# Patient Record
Sex: Female | Born: 1965 | Smoking: Never smoker
Health system: Southern US, Community
[De-identification: ages and names within clinical notes are randomized; demographics above are authoritative.]

## PROBLEM LIST (undated history)

## (undated) DIAGNOSIS — R1900 Intra-abdominal and pelvic swelling, mass and lump, unspecified site: Secondary | ICD-10-CM

---

## 2012-05-24 ENCOUNTER — Emergency Department: Payer: Self-pay | Admitting: Emergency Medicine

## 2012-05-24 LAB — URINALYSIS, COMPLETE
Bacteria: NONE SEEN
Nitrite: NEGATIVE
Protein: NEGATIVE
RBC,UR: 229797 /HPF (ref 0–5)
RBC,UR: 3 /HPF (ref 0–5)
Specific Gravity: 1.019 (ref 1.003–1.030)
Squamous Epithelial: NONE SEEN

## 2012-05-24 LAB — PREGNANCY, URINE: Pregnancy Test, Urine: NEGATIVE m[IU]/mL

## 2012-05-24 LAB — CBC
HCT: 39.4 % (ref 35.0–47.0)
MCV: 98 fL (ref 80–100)
Platelet: 247 10*3/uL (ref 150–440)
WBC: 5.1 10*3/uL (ref 3.6–11.0)

## 2012-08-02 ENCOUNTER — Emergency Department: Payer: Self-pay | Admitting: Emergency Medicine

## 2012-08-02 LAB — CBC
HCT: 36.9 % (ref 35.0–47.0)
MCHC: 34.7 g/dL (ref 32.0–36.0)
MCV: 96 fL (ref 80–100)
Platelet: 237 10*3/uL (ref 150–440)
RBC: 3.85 10*6/uL (ref 3.80–5.20)
WBC: 5.9 10*3/uL (ref 3.6–11.0)

## 2012-08-02 LAB — COMPREHENSIVE METABOLIC PANEL
Albumin: 4.2 g/dL (ref 3.4–5.0)
Alkaline Phosphatase: 67 U/L (ref 50–136)
Anion Gap: 8 (ref 7–16)
Bilirubin,Total: 0.6 mg/dL (ref 0.2–1.0)
Calcium, Total: 9.1 mg/dL (ref 8.5–10.1)
Chloride: 104 mmol/L (ref 98–107)
Co2: 25 mmol/L (ref 21–32)
Creatinine: 0.67 mg/dL (ref 0.60–1.30)
EGFR (African American): >60
EGFR (Non-African Amer.): >60
Glucose: 95 mg/dL (ref 65–99)
Potassium: 3.5 mmol/L (ref 3.5–5.1)
SGOT(AST): 22 U/L (ref 15–37)
SGPT (ALT): 21 U/L (ref 12–78)
Sodium: 137 mmol/L (ref 136–145)
Total Protein: 8.5 g/dL — ABNORMAL HIGH (ref 6.4–8.2)

## 2012-08-02 LAB — URINALYSIS, COMPLETE
Bacteria: NONE SEEN
Blood: NEGATIVE
Glucose,UR: NEGATIVE mg/dL (ref 0–75)
Leukocyte Esterase: NEGATIVE
Nitrite: NEGATIVE
Ph: 6 (ref 4.5–8.0)
RBC,UR: 1 /HPF (ref 0–5)
Specific Gravity: 1.004 (ref 1.003–1.030)
WBC UR: 1 /HPF (ref 0–5)

## 2012-08-02 LAB — LIPASE, BLOOD: Lipase: 127 U/L (ref 73–393)

## 2012-08-04 ENCOUNTER — Ambulatory Visit: Payer: Self-pay | Admitting: Gynecologic Oncology

## 2012-08-12 HISTORY — PX: LAPAROSCOPIC SALPINGOOPHERECTOMY: SUR795

## 2018-03-17 ENCOUNTER — Encounter: Payer: Self-pay | Admitting: *Deleted

## 2018-03-18 ENCOUNTER — Ambulatory Visit: Payer: PRIVATE HEALTH INSURANCE | Admitting: Anesthesiology

## 2018-03-18 ENCOUNTER — Encounter: Payer: Self-pay | Admitting: Anesthesiology

## 2018-03-18 ENCOUNTER — Encounter
Admission: RE | Disposition: A | Discharge status: Home / Self Care | Payer: Self-pay | Source: Ambulatory Visit | Attending: Internal Medicine

## 2018-03-18 ENCOUNTER — Ambulatory Visit
Admission: RE | Admit: 2018-03-18 | Discharge: 2018-03-18 | Disposition: A | Discharge status: Home / Self Care | Payer: PRIVATE HEALTH INSURANCE | Source: Ambulatory Visit | Attending: Internal Medicine | Admitting: Internal Medicine

## 2018-03-18 DIAGNOSIS — Z1211 Encounter for screening for malignant neoplasm of colon: Secondary | ICD-10-CM | POA: Diagnosis not present

## 2018-03-18 DIAGNOSIS — R1310 Dysphagia, unspecified: Secondary | ICD-10-CM | POA: Diagnosis present

## 2018-03-18 DIAGNOSIS — K2270 Barrett's esophagus without dysplasia: Secondary | ICD-10-CM | POA: Insufficient documentation

## 2018-03-18 DIAGNOSIS — K640 First degree hemorrhoids: Secondary | ICD-10-CM | POA: Insufficient documentation

## 2018-03-18 DIAGNOSIS — K2950 Unspecified chronic gastritis without bleeding: Secondary | ICD-10-CM | POA: Diagnosis not present

## 2018-03-18 DIAGNOSIS — K449 Diaphragmatic hernia without obstruction or gangrene: Secondary | ICD-10-CM | POA: Insufficient documentation

## 2018-03-18 DIAGNOSIS — K210 Gastro-esophageal reflux disease with esophagitis: Secondary | ICD-10-CM | POA: Diagnosis not present

## 2018-03-18 HISTORY — PX: COLONOSCOPY WITH PROPOFOL: SHX5780

## 2018-03-18 HISTORY — PX: ESOPHAGOGASTRODUODENOSCOPY (EGD) WITH PROPOFOL: SHX5813

## 2018-03-18 HISTORY — DX: Intra-abdominal and pelvic swelling, mass and lump, unspecified site: R19.00

## 2018-03-18 LAB — POCT PREGNANCY, URINE: Preg Test, Ur: NEGATIVE

## 2018-03-18 SURGERY — COLONOSCOPY WITH PROPOFOL
Anesthesia: General

## 2018-03-18 MED ORDER — PROPOFOL 500 MG/50ML IV EMUL
INTRAVENOUS | Status: DC | PRN
  Administered 2018-03-18: 80 ug/kg/min via INTRAVENOUS

## 2018-03-18 MED ORDER — PROPOFOL 500 MG/50ML IV EMUL
INTRAVENOUS | Status: AC
  Filled 2018-03-18: qty 50

## 2018-03-18 MED ORDER — SODIUM CHLORIDE 0.9 % IV SOLN
INTRAVENOUS | Status: DC
  Administered 2018-03-18 (×2): via INTRAVENOUS

## 2018-03-18 MED ORDER — PROPOFOL 10 MG/ML IV BOLUS
INTRAVENOUS | Status: DC | PRN
  Administered 2018-03-18: 50 mg via INTRAVENOUS

## 2018-03-18 NOTE — H&P (Signed)
Outpatient short stay form Pre-procedure 03/18/2018 9:54 AM Jennifer Crane K. Norma Fredrickson, M.D.  Primary Physician: Marcelino Duster, M.D.  Reason for visit:  Dysphagia. GERD, colon cancer screening  History of present illness: 53 y/o [place] patient presents with intermittent solid food dysphagia, GERD and recent issues with epigastric pain. Also presents for colon cancer screening. Naive to colonoscopy.     Current Facility-Administered Medications:  .  0.9 %  sodium chloride infusion, , Intravenous, Continuous, Ineta Sinning, Boykin Nearing, MD  Medications Prior to Admission  Medication Sig Dispense Refill Last Dose  . esomeprazole (NEXIUM) 20 MG capsule Take 20 mg by mouth daily at 12 noon.        Not on File   Past Medical History:  Diagnosis Date  . Pelvic mass     Review of systems:  Otherwise negative.    Physical Exam  Gen: Alert, oriented. Appears stated age.  HEENT: [place]/AT. PERRLA. Lungs: CTA, no wheezes. CV: RR nl S1, S2. Abd: soft, benign, no masses. BS+ Ext: No edema. Pulses 2+    Planned procedures: Proceed with EGD and colonoscopy. The patient understands the nature of the planned procedure, indications, risks, alternatives and potential complications including but not limited to bleeding, infection, perforation, damage to internal organs and possible oversedation/side effects from anesthesia. The patient agrees and gives consent to proceed.  Please refer to procedure notes for findings, recommendations and patient disposition/instructions.     Raeli Wiens K. Norma Fredrickson, M.D. Gastroenterology 03/18/2018  9:54 AM

## 2018-03-18 NOTE — Op Note (Signed)
[facility] Gastroenterology Patient Name: Jennifer Crane Procedure Date: 03/18/2018 10:35 AM MRN: [id number] Account #: [id number] Date of Birth: 1965/10/03 Admit Type: Outpatient Age: [age] [facility]OM 3 Gender: Female Note Status: Finalized Procedure:            Upper GI endoscopy Indications:          Epigastric abdominal pain, Heartburn, Suspected                        esophageal reflux Providers:            Boykin Nearingeodoro K. Norma Fredricksonoledo MD, MD Referring MD:         Gracelyn NurseJohn D. Johnston, MD (Referring MD) Medicines:            Propofol per Anesthesia Complications:        No immediate complications. Procedure:            Pre-Anesthesia Assessment:                       - The risks and benefits of the procedure and the                        sedation options and risks were discussed with the                        patient. All questions were answered and informed                        consent was obtained.                       - Patient identification and proposed procedure were                        verified prior to the procedure by the nurse. The                        procedure was verified in the procedure room.                       - ASA Grade Assessment: II - A patient with mild                        systemic disease.                       - After reviewing the risks and benefits, the patient                        was deemed in satisfactory condition to undergo the                        procedure.                       After obtaining informed consent, the endoscope was                        passed under direct vision. Throughout the procedure,  the patient's blood pressure, pulse, and oxygen                        saturations were monitored continuously. The Endoscope                        was introduced through the mouth, and advanced to the                        third part of duodenum. The upper GI endoscopy was                         accomplished without difficulty. The patient tolerated                        the procedure well. Findings:      The Z-line was irregular and was found at the gastroesophageal junction.       Mucosa was biopsied with a cold forceps for histology. One specimen       bottle was sent to pathology.      A 1 cm hiatal hernia was present.      Normal mucosa was found in the entire examined stomach. Biopsies were       taken with a cold forceps for Helicobacter pylori testing.      The examined duodenum was normal.      The exam was otherwise without abnormality. Impression:           - Z-line irregular, at the gastroesophageal junction.                        Biopsied.                       - 1 cm hiatal hernia.                       - Normal mucosa was found in the entire stomach.                        Biopsied.                       - Normal examined duodenum.                       - The examination was otherwise normal. Recommendation:       - Await pathology results.                       - Proceed with colonoscopy Procedure Code(s):    --- Professional ---                       [phone], Esophagogastroduodenoscopy, flexible, transoral;                        with biopsy, single or multiple Diagnosis Code(s):    --- Professional ---                       R12, Heartburn                       R10.13, Epigastric pain  K44.9, Diaphragmatic hernia without obstruction or                        gangrene                       K22.8, Other specified diseases of esophagus CPT copyright 2018 American [employer] Association. All rights reserved. The codes documented in this report are preliminary and upon coder review may  be revised to meet current compliance requirements. Stanton Kidney MD, MD 03/18/2018 10:45:00 AM This report has been signed electronically. Number of Addenda: 0 Note Initiated On: 03/18/2018 10:35 AM      [facility]

## 2018-03-18 NOTE — Transfer of Care (Signed)
Immediate Anesthesia Transfer of Care Note  Patient: Jennifer Crane  Procedure(s) Performed: COLONOSCOPY WITH PROPOFOL (N/A ) ESOPHAGOGASTRODUODENOSCOPY (EGD) WITH PROPOFOL (N/A )  Patient Location: PACU  Anesthesia Type:General  Level of Consciousness: awake  Airway & Oxygen Therapy: Patient Spontanous Breathing  Post-op Assessment: Report given to RN  Post vital signs: stable  Last Vitals:  Vitals Value Taken Time  BP    Temp    Pulse    Resp    SpO2      Last Pain:  Vitals:   03/18/18 1001  TempSrc: Tympanic         Complications: No apparent anesthesia complications

## 2018-03-18 NOTE — Op Note (Signed)
[facility] Gastroenterology Patient Name: Jennifer Crane Procedure Date: 03/18/2018 10:35 AM MRN: [id number] Account #: [id number] Date of Birth: [birth year] Admit Type: Outpatient Age: 53 Room: [facility] ENDO ROOM 3 Gender: Female Note Status: Finalized Procedure:            Colonoscopy Indications:          Screening for colorectal malignant neoplasm Providers:            Boykin Nearing. Norma Fredrickson MD, MD Referring MD:         Gracelyn Nurse, MD (Referring MD) Medicines:            Propofol per Anesthesia Complications:        No immediate complications. Procedure:            Pre-Anesthesia Assessment:                       - The risks and benefits of the procedure and the                        sedation options and risks were discussed with the                        patient. All questions were answered and informed                        consent was obtained.                       - Patient identification and proposed procedure were                        verified prior to the procedure by the nurse. The                        procedure was verified in the procedure room.                       - ASA Grade Assessment: II - A patient with mild                        systemic disease.                       - After reviewing the risks and benefits, the patient                        was deemed in satisfactory condition to undergo the                        procedure.                       After obtaining informed consent, the colonoscope was                        passed under direct vision. Throughout the procedure,                        the patient's blood pressure, pulse, and oxygen  saturations were monitored continuously. The                        Colonoscope was introduced through the anus and                        advanced to the the cecum, identified by appendiceal                        orifice and ileocecal valve. The colonoscopy was             performed without difficulty. The patient tolerated the                        procedure well. The quality of the bowel preparation                        was good. The ileocecal valve, appendiceal orifice, and                        rectum were photographed. Findings:      The perianal and digital rectal examinations were normal. Pertinent       negatives include normal sphincter tone and no palpable rectal lesions.      The colon (entire examined portion) appeared normal.      Non-bleeding internal hemorrhoids were found during retroflexion. The       hemorrhoids were Grade I (internal hemorrhoids that do not prolapse).      The exam was otherwise without abnormality. Impression:           - The entire examined colon is normal.                       - Non-bleeding internal hemorrhoids.                       - The examination was otherwise normal.                       - No specimens collected. Recommendation:       - Patient has a contact number available for                        emergencies. The signs and symptoms of potential                        delayed complications were discussed with the patient.                        Return to normal activities tomorrow. Written discharge                        instructions were provided to the patient.                       - Await pathology results from EGD, also performed                        today.                       - Resume previous diet.                       -  Continue present medications.                       - Repeat colonoscopy in 10 years for screening purposes.                       - Return to GI office PRN.                       - The findings and recommendations were discussed with                        the patient and their family. Procedure Code(s):    --- Professional ---                       [id number], Colorectal cancer screening; colonoscopy on                        individual not meeting criteria for  high risk Diagnosis Code(s):    --- Professional ---                       K64.0, First degree hemorrhoids                       Z12.11, Encounter for screening for malignant neoplasm                        of colon CPT copyright 2018 American Medical Association. All rights reserved. The codes documented in this report are preliminary and upon coder review may  be revised to meet current compliance requirements. Stanton Kidneyeodoro K Alanda Colton MD, MD 03/18/2018 10:57:42 AM This report has been signed electronically. Number of Addenda: 0 Note Initiated On: 03/18/2018 10:35 AM Scope Withdrawal Time: 0 hours 5 minutes 43 seconds  Total Procedure Duration: 0 hours 8 minutes 44 seconds       [facility]

## 2018-03-18 NOTE — Anesthesia Preprocedure Evaluation (Signed)
Anesthesia Evaluation  Patient identified by MRN, date of birth, ID band Patient awake    Reviewed: Allergy & Precautions, NPO status , Patient's Chart, lab work & pertinent test results, reviewed documented beta blocker date and time   Airway Mallampati: II  TM Distance: >3 FB     Dental  (+) Chipped   Pulmonary           Cardiovascular      Neuro/Psych    GI/Hepatic   Endo/Other    Renal/GU      Musculoskeletal   Abdominal   Peds  Hematology   Anesthesia Other Findings EKG ok from 4 yrs.  Reproductive/Obstetrics                             Anesthesia Physical Anesthesia Plan  ASA: II  Anesthesia Plan: General   Post-op Pain Management:    Induction: Intravenous  PONV Risk Score and Plan:   Airway Management Planned:   Additional Equipment:   Intra-op Plan:   Post-operative Plan:   Informed Consent: I have reviewed the patients History and Physical, chart, labs and discussed the procedure including the risks, benefits and alternatives for the proposed anesthesia with the patient or authorized representative who has indicated his/her understanding and acceptance.       Plan Discussed with: CRNA  Anesthesia Plan Comments:         Anesthesia Quick Evaluation

## 2018-03-18 NOTE — Anesthesia Post-op Follow-up Note (Signed)
Anesthesia QCDR form completed.        

## 2018-03-18 NOTE — OR Nursing (Addendum)
Kim Interpreter here while Dr. Norma Fredrickson went over pt's procedure results.

## 2018-03-18 NOTE — Interval H&P Note (Signed)
History and Physical Interval Note:  03/18/2018 9:56 AM  Jennifer Crane  has presented today for surgery, with the diagnosis of Colon cancer screening, GERD  The various methods of treatment have been discussed with the patient and family. After consideration of risks, benefits and other options for treatment, the patient has consented to  Procedure(s): COLONOSCOPY WITH PROPOFOL (N/A) ESOPHAGOGASTRODUODENOSCOPY (EGD) WITH PROPOFOL (N/A) as a surgical intervention .  The patient's history has been reviewed, patient examined, no change in status, stable for surgery.  I have reviewed the patient's chart and labs.  Questions were answered to the patient's satisfaction.     [place], [place]

## 2018-03-18 NOTE — Anesthesia Postprocedure Evaluation (Signed)
Anesthesia Post Note  Patient: Jennifer Crane  Procedure(s) Performed: COLONOSCOPY WITH PROPOFOL (N/A ) ESOPHAGOGASTRODUODENOSCOPY (EGD) WITH PROPOFOL (N/A )  Patient location during evaluation: Endoscopy Anesthesia Type: General Level of consciousness: awake and alert Pain management: pain level controlled Vital Signs Assessment: post-procedure vital signs reviewed and stable Respiratory status: spontaneous breathing, nonlabored ventilation, respiratory function stable and patient connected to nasal cannula oxygen Cardiovascular status: blood pressure returned to baseline and stable Postop Assessment: no apparent nausea or vomiting Anesthetic complications: no     Last Vitals:  Vitals:   03/18/18 1118 03/18/18 1128  BP: 110/68 119/83  Pulse: 74 73  Resp: 20 14  Temp:    SpO2: 100% 100%    Last Pain:  Vitals:   03/18/18 1128  TempSrc:   PainSc: 0-No pain                 Ridhima Golberg S

## 2018-03-19 ENCOUNTER — Encounter: Payer: Self-pay | Admitting: Internal Medicine

## 2018-03-19 LAB — SURGICAL PATHOLOGY

## 2019-11-29 ENCOUNTER — Other Ambulatory Visit: Payer: Self-pay | Admitting: Internal Medicine

## 2019-11-29 DIAGNOSIS — Z1231 Encounter for screening mammogram for malignant neoplasm of breast: Secondary | ICD-10-CM

## 2019-12-20 ENCOUNTER — Ambulatory Visit
Admission: RE | Admit: 2019-12-20 | Discharge: 2019-12-20 | Disposition: A | Discharge status: Home / Self Care | Payer: BLUE CROSS/BLUE SHIELD | Source: Ambulatory Visit | Attending: Internal Medicine | Admitting: Internal Medicine

## 2019-12-20 ENCOUNTER — Other Ambulatory Visit: Payer: Self-pay

## 2019-12-20 DIAGNOSIS — Z1231 Encounter for screening mammogram for malignant neoplasm of breast: Secondary | ICD-10-CM | POA: Diagnosis not present

## 2022-04-12 IMAGING — MG DIGITAL SCREENING BILAT W/ TOMO W/ CAD
8 series · 8 of 24 positions shown · non-contrast
Comparison: None.

CLINICAL DATA: Screening.

EXAM:
DIGITAL SCREENING BILATERAL MAMMOGRAM WITH TOMO AND CAD

[R CC synth-2D]
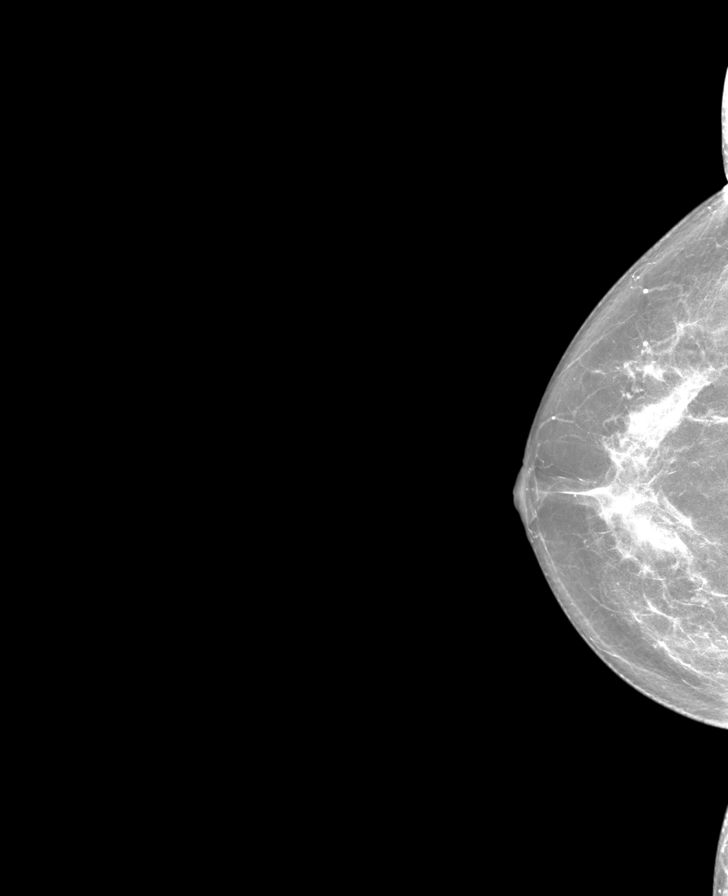

[L MLO synth-2D]
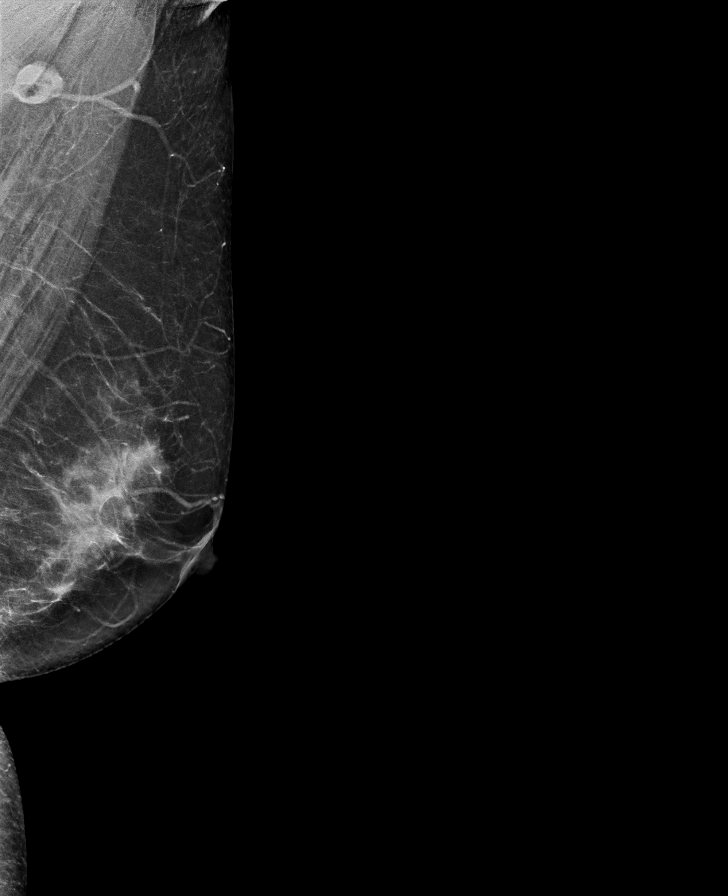

[R MLO synth-2D]
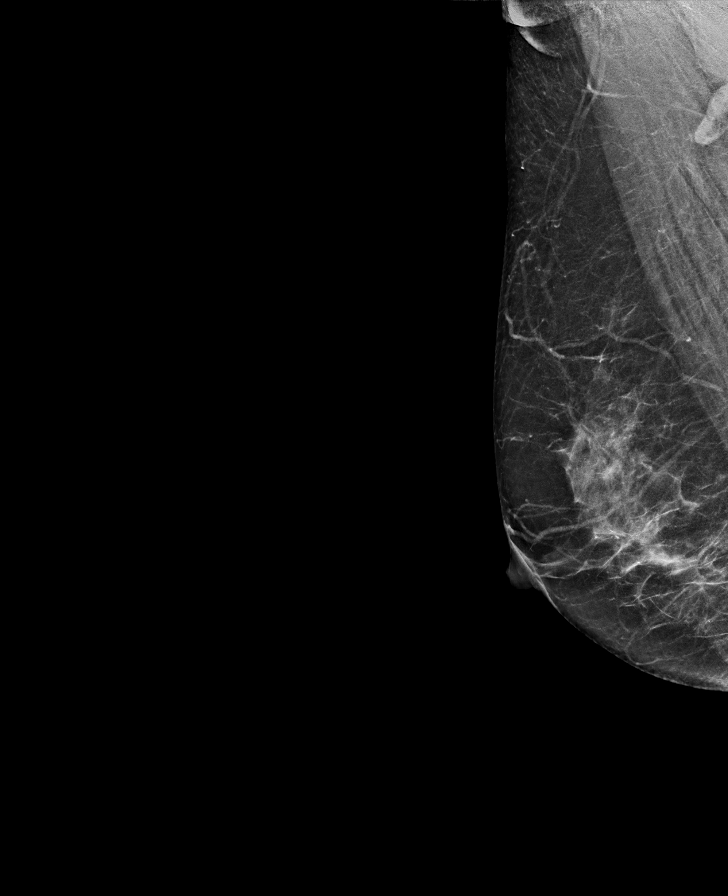

[L CC synth-2D]
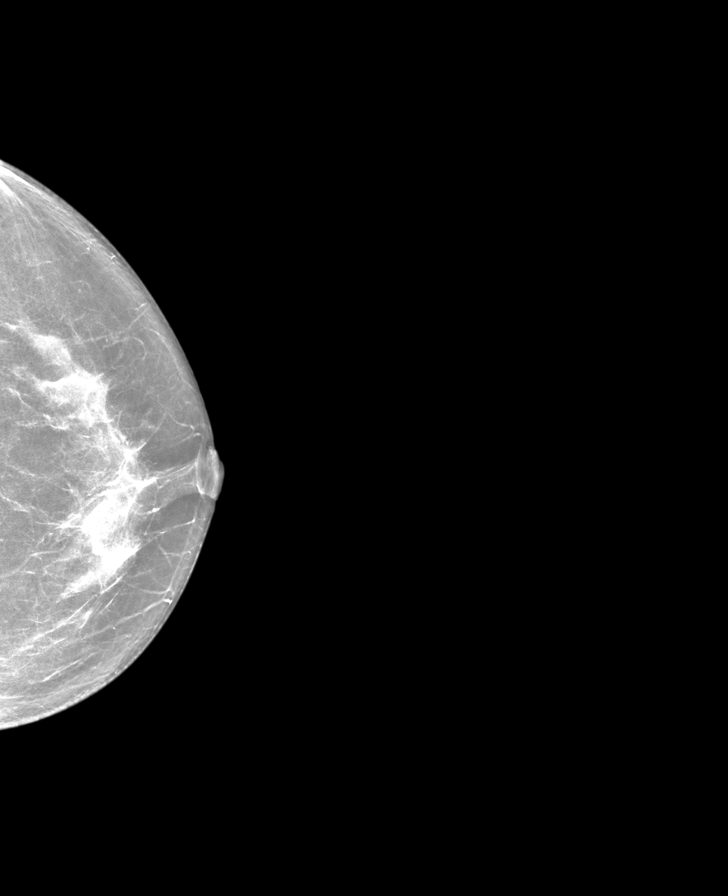

[L MLO tomo · tomo slice 34/67.0]
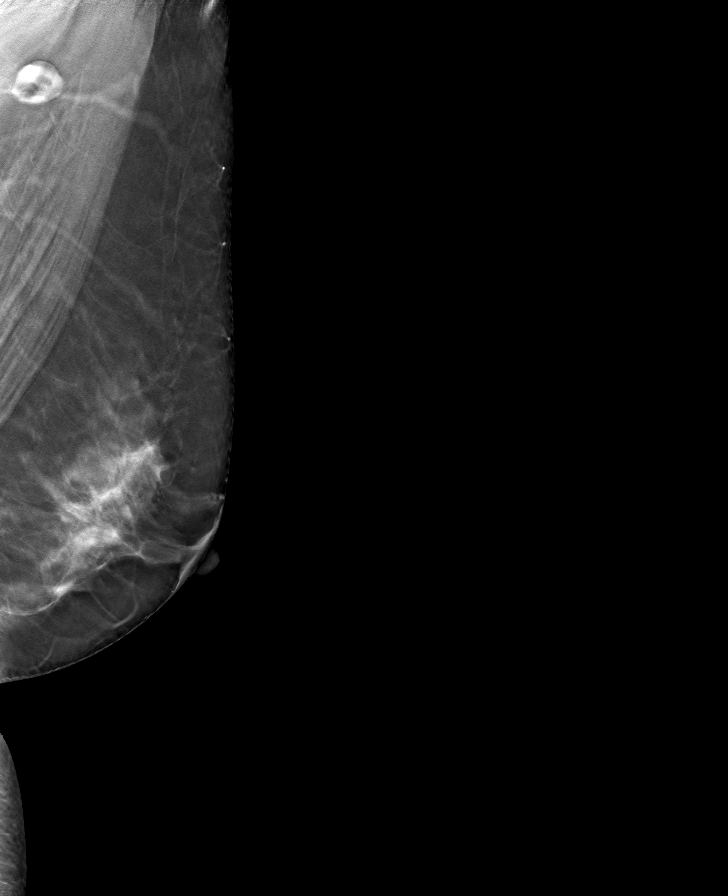

[R CC tomo · tomo slice 35/68.0]
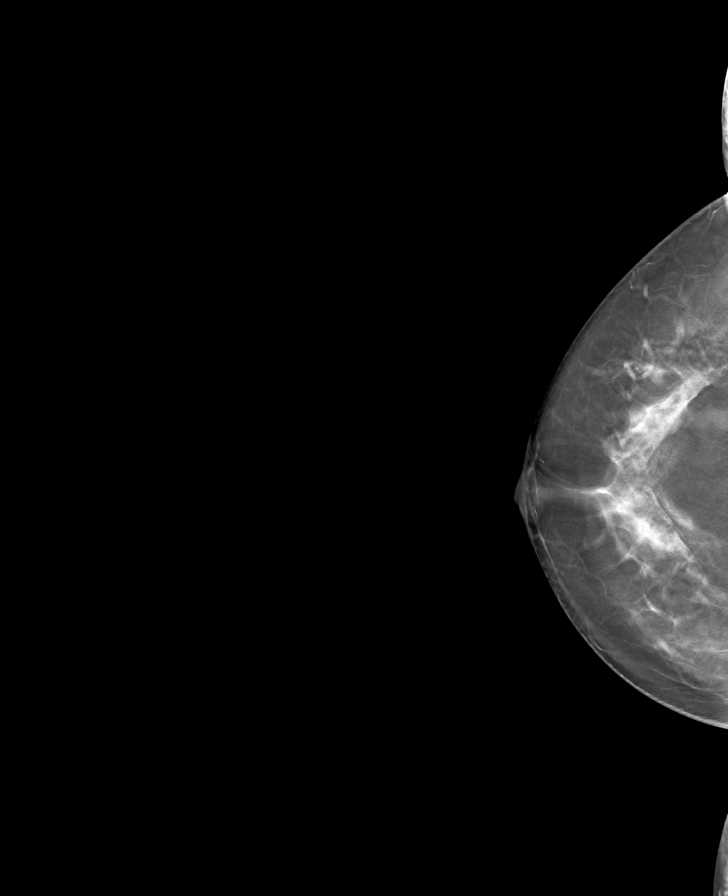

[L CC tomo · tomo slice 35/68.0]
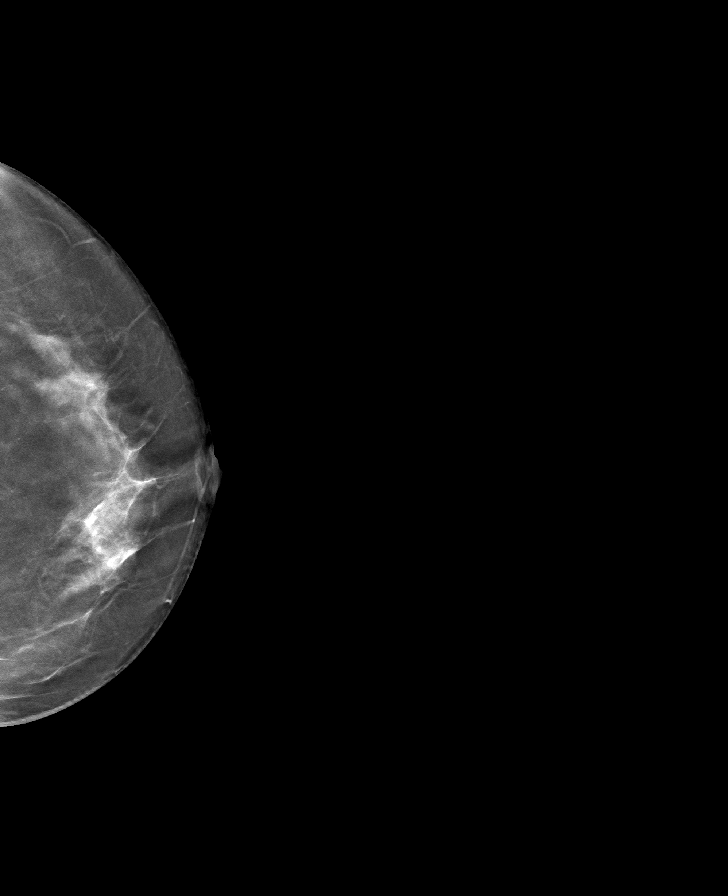

[R MLO tomo · tomo slice 32/63.0]
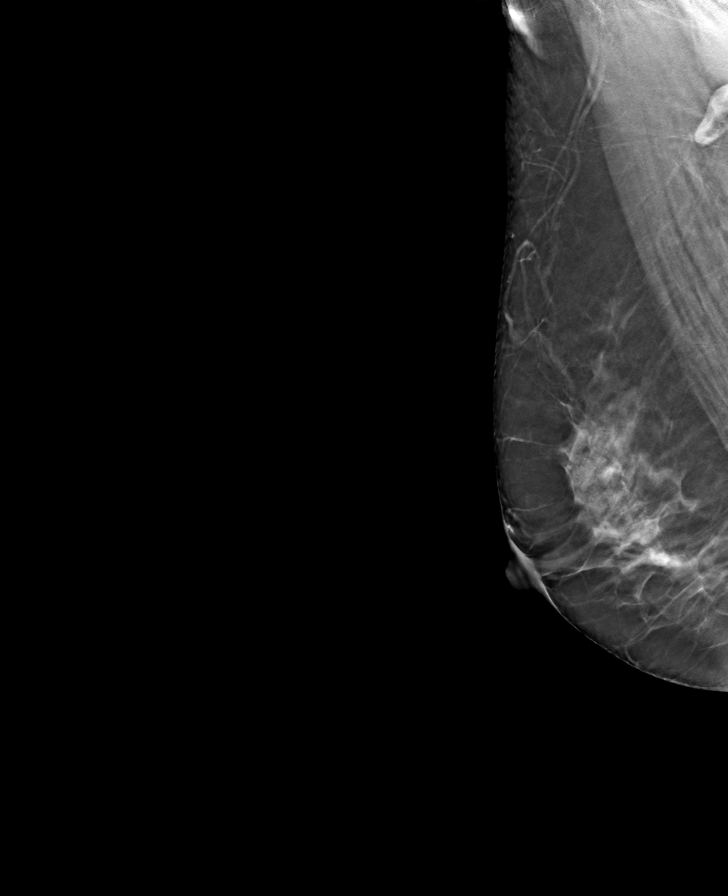

[8 of 24 positions shown; findings below may reference images not displayed]

ACR Breast Density Category c: The breast tissue is heterogeneously
dense, which may obscure small masses
FINDINGS: There are no findings suspicious for malignancy. Images were
processed with CAD.
IMPRESSION: No mammographic evidence of malignancy. A result letter of this
screening mammogram will be mailed directly to the patient.

RECOMMENDATION:
Screening mammogram in one year. (Code:EM-2-IHY)

BI-RADS CATEGORY  1: Negative.

## 2024-01-29 ENCOUNTER — Emergency Department: Admission: EM | Admit: 2024-01-29 | Discharge: 2024-01-30 | Disposition: A | Discharge status: Home / Self Care

## 2024-01-29 ENCOUNTER — Emergency Department

## 2024-01-29 ENCOUNTER — Other Ambulatory Visit: Payer: Self-pay

## 2024-01-29 DIAGNOSIS — I10 Essential (primary) hypertension: Secondary | ICD-10-CM | POA: Insufficient documentation

## 2024-01-29 DIAGNOSIS — M25532 Pain in left wrist: Secondary | ICD-10-CM | POA: Insufficient documentation

## 2024-01-29 DIAGNOSIS — S52502A Unspecified fracture of the lower end of left radius, initial encounter for closed fracture: Secondary | ICD-10-CM | POA: Insufficient documentation

## 2024-01-29 DIAGNOSIS — W19XXXA Unspecified fall, initial encounter: Secondary | ICD-10-CM

## 2024-01-29 DIAGNOSIS — W01198A Fall on same level from slipping, tripping and stumbling with subsequent striking against other object, initial encounter: Secondary | ICD-10-CM | POA: Insufficient documentation

## 2024-01-29 DIAGNOSIS — R519 Headache, unspecified: Secondary | ICD-10-CM | POA: Insufficient documentation

## 2024-01-29 MED ORDER — ONDANSETRON HCL 4 MG/2ML IJ SOLN
4.0000 mg | Freq: Once | INTRAMUSCULAR | Status: AC
  Administered 2024-01-29: 4 mg via INTRAVENOUS
  Filled 2024-01-29: qty 2

## 2024-01-29 MED ORDER — PROPOFOL 10 MG/ML IV BOLUS
0.5000 mg/kg | INTRAVENOUS | Status: DC | PRN
  Administered 2024-01-29 (×3): 26.2 mg via INTRAVENOUS
  Filled 2024-01-29 (×2): qty 20

## 2024-01-29 MED ORDER — MORPHINE SULFATE (PF) 4 MG/ML IV SOLN
4.0000 mg | Freq: Once | INTRAVENOUS | Status: AC
  Administered 2024-01-29: 4 mg via INTRAVENOUS
  Filled 2024-01-29: qty 1

## 2024-01-29 MED ORDER — OXYCODONE-ACETAMINOPHEN 5-325 MG PO TABS: 1.0000 | ORAL_TABLET | ORAL | 0 refills | Status: AC | PRN

## 2024-01-29 MED ORDER — SODIUM CHLORIDE 0.9 % IV BOLUS
1000.0000 mL | Freq: Once | INTRAVENOUS | Status: AC
  Administered 2024-01-29: 1000 mL via INTRAVENOUS

## 2024-01-29 MED ORDER — FENTANYL CITRATE (PF) 50 MCG/ML IJ SOSY
50.0000 ug | PREFILLED_SYRINGE | Freq: Once | INTRAMUSCULAR | Status: AC
  Administered 2024-01-29: 50 ug via INTRAVENOUS
  Filled 2024-01-29: qty 1

## 2024-01-29 NOTE — ED Triage Notes (Signed)
 Pt BIB [facility] from home for fall from standing. -LOC, head strike or thinners. Deformity to l wrist noted. Pt refused splint and medications from EMS.

## 2024-01-29 NOTE — ED Notes (Addendum)
 Applied splint to left arm. MD approved of this splint. Applied sling to pt left arm per MD. Pt signed ortho health form at this time with understanding of the information given. Provided pt with the sling box and copy of ortho order form. Instructions given through interpreter patient verbalizes understanding.

## 2024-01-29 NOTE — ED Provider Notes (Signed)
 [facility] Provider Note    Event Date/Time   First MD Initiated Contact with Patient 01/29/24 2029     (approximate)   History   Fall   HPI History is obtained using the iPad Vietnamese interpreter  Jennifer Crane is a 58 y.o. female with a past medical history of hypertension who presents to the emergency department after a trip and fall with a headache and left wrist pain.  Patient arrives via EMS.  She was in her usual state of health and states that she lost her balance on an item on the floor and fell forward and hit her head.  Denies any loss of consciousness not on any blood thinners.  She reports 10 out of 10 pain in her left wrist and denies any decrease sensation.  She is right-handed.      Physical Exam   Triage Vital Signs: ED Triage Vitals  Encounter Vitals Group     BP 01/29/24 2023 119/74     Girls Systolic BP Percentile --      Girls Diastolic BP Percentile --      Boys Systolic BP Percentile --      Boys Diastolic BP Percentile --      Pulse Rate 01/29/24 2023 78     Resp 01/29/24 2023 19     Temp 01/29/24 2023 98.2 F (36.8 C)     Temp Source 01/29/24 2023 Oral     SpO2 01/29/24 2023 100 %     Weight 01/29/24 2024 115 lb 4.8 oz (52.3 kg)     Height 01/29/24 2024 4' 10 (1.473 m)     Head Circumference --      Peak Flow --      Pain Score 01/29/24 2023 10     Pain Loc --      Pain Education --      Exclude from Growth Chart --     Most recent vital signs: Vitals:   01/29/24 2330 01/29/24 2345  BP: 115/77   Pulse: 83 96  Resp: 15 14  Temp:    SpO2: 100% 100%    Nursing Triage Note reviewed. Vital signs reviewed and patients oxygen saturation is normoxic  General: Patient is well nourished, well developed, awake and alert, resting comfortably in no acute distress Head: Normocephalic no scalp lacerations or abrasions Eyes: Normal inspection, extraocular muscles intact, no conjunctival pallor Ear, nose, throat: Normal  external exam Neck: Normal range of motion Respiratory: Patient is in no respiratory distress, lungs CTAB Cardiovascular: Patient is not tachycardic, RRR without murmur appreciated GI: Abd SNT with no guarding or rebound  Back: Normal inspection of the back with good strength and range of motion throughout all ext Extremities: pulses intact with good cap refills, no [place] pitting edema or calf tenderness Left wrist with obvious deformity and ecchymosis but sensation is intact in the median and ulnar distributions Neuro: The patient is alert and oriented to person, place, and time, appropriately conversive, with 5/5 bilat UE/[place] strength, no gross motor or sensory defects noted. Coordination appears to be adequate. Skin: Warm, dry, and intact Psych: normal mood and affect, no SI or HI  ED Results / Procedures / Treatments   Labs (all labs ordered are listed, but only abnormal results are displayed) Labs Reviewed - No data to display   EKG   RADIOLOGY CT head: No intracranial hemorrhage on my independent review interpretation radiologist agrees CT C-spine: No acute abnormality on my independent  review interpretation radiologist agrees X-ray of elbow: No acute abnormality X-ray of left wrist: Comminuted related and fractured distal radius on my independent review interpretation radiologist agrees X-ray of left wrist: Improved alignment on my independent review and interpretation radiologist agrees    PROCEDURES:  Critical Care performed: No  .Sedation  Date/Time: 01/30/2024 12:37 AM  Performed by: Nicholaus Rolland BRAVO, MD Authorized by: Nicholaus Rolland BRAVO, MD   Consent:    Consent obtained:  Verbal and written   Consent given by:  Patient   Risks discussed:  Allergic reaction, dysrhythmia and prolonged hypoxia resulting in organ damage   Alternatives discussed:  Anxiolysis and analgesia without sedation Universal protocol:    Procedure explained and questions answered to patient or  proxy's satisfaction: yes     Immediately prior to procedure, a time out was called: yes   Pre-sedation assessment:    Time since last food or drink:  6 hours ago   ASA classification: class 2 - patient with mild systemic disease     Mallampati score:  I - soft palate, uvula, fauces, pillars visible   Neck mobility: normal     Pre-sedation assessments completed and reviewed: airway patency   A pre-sedation assessment was completed prior to the start of the procedure Immediate pre-procedure details:    Reassessment: Patient reassessed immediately prior to procedure     Reviewed: relevant labs/tests     Verified: bag valve mask available   Procedure details (see MAR for exact dosages):    Preoxygenation:  Nasal cannula   Sedation:  Propofol    Intended level of sedation: deep   Total Provider sedation time (minutes):  23 Post-procedure details:   A post-sedation assessment was completed following the completion of the procedure.   Procedure completion:  Tolerated well, no immediate complications .Reduction of fracture  Date/Time: 01/30/2024 12:39 AM  Performed by: Nicholaus Rolland BRAVO, MD Authorized by: Nicholaus Rolland BRAVO, MD  Consent: Verbal consent obtained. Written consent obtained Consent given by: patient Patient identity confirmed: verbally with patient and arm band  Sedation: Patient sedated: yes  Patient tolerance: patient tolerated the procedure well with no immediate complications Comments: Reduction of left distal radial fracture   .Splint Application  Date/Time: 01/30/2024 12:39 AM  Performed by: Nicholaus Rolland BRAVO, MD Authorized by: Nicholaus Rolland BRAVO, MD   Consent:    Consent obtained:  Verbal and written   Risks discussed:  Discoloration, numbness and pain   Alternatives discussed:  No treatment Universal protocol:    Patient identity confirmed:  Verbally with patient and arm band Pre-procedure details:    Distal neurologic exam:  Normal   Distal perfusion: distal  pulses strong and brisk capillary refill   Procedure details:    Location:  Wrist   Wrist location:  L wrist   Cast type:  Long arm   Splint type:  Sugar tong   Supplies:  Fiberglass Post-procedure details:    Distal neurologic exam:  Normal   Distal perfusion: distal pulses strong, brisk capillary refill and unchanged     Procedure completion:  Tolerated well, no immediate complications Comments:     Splint applied by ED tech which I supervised and was active throughout    MEDICATIONS ORDERED IN ED: Medications  propofol  (DIPRIVAN ) 10 mg/mL bolus/IV push 26.2 mg (26.2 mg Intravenous Given 01/29/24 2245)  morphine  (PF) 4 MG/ML injection 4 mg (4 mg Intravenous Given 01/29/24 2141)  ondansetron  (ZOFRAN ) injection 4 mg (4 mg Intravenous Given 01/29/24 2141)  sodium chloride  0.9 % bolus 1,000 mL (0 mLs Intravenous Stopped 01/30/24 0002)  fentaNYL  (SUBLIMAZE ) injection 50 mcg (50 mcg Intravenous Given 01/29/24 2222)     IMPRESSION / MDM / ASSESSMENT AND PLAN / ED COURSE                                Differential diagnosis includes, but is not limited to, intracranial hemorrhage, radial fracture, ulnar fracture, contusion   ED course: Patient presents after a trip and fall.  Given the head strike and headache, CT head and C-spine were obtained which demonstrated no acute abnormality.  Patient did have a fracture of the distal radial bone that was culminated.  She required 2 sedation attempts and 2 reduction attempts with eventual good alignment.  Case was discussed with on-call orthopedist Dr. Ezra who reviewed the images and agree with the alignment.  Patient was placed in a sugar-tong splint and reassessment patient had good perfusion.  Patient will call tomorrow to arrange follow-up with Dr. Ezra.  I did send the patient home with a short course of Percocet.  She will return with any acutely worsening symptoms.   Clinical Course as of 01/30/24 0041  Thu Jan 29, 2024  2204 CT Head  Wo Contrast No acute abnormality [HD]  2318 Orthopedics paged [HD]    Clinical Course User Index [HD] Nicholaus Rolland BRAVO, MD   At time of discharge there is no evidence of acute life, limb, vision, or fertility threat. Patient has stable vital signs, pain is well controlled, patient is ambulatory and p.o. tolerant.  Discharge instructions were completed using the EPIC system. I would refer you to those at this time. All warnings prescriptions follow-up etc. were discussed in detail with the patient. Patient indicates understanding and is agreeable with this plan. All questions answered.  Patient is made aware that they may return to the emergency department for any worsening or new condition or for any other emergency.   -- Risk: 5 This patient has a high risk of morbidity due to further diagnostic testing or treatment. Rationale: This patient's evaluation and management involve a high risk of morbidity due to the potential severity of presenting symptoms, need for diagnostic testing, and/or initiation of treatment that may require close monitoring. The differential includes conditions with potential for significant deterioration or requiring escalation of care. Treatment decisions in the ED, including medication administration, procedural interventions, or disposition planning, reflect this level of risk. COPA: 5 The patient has the following acute or chronic illness/injury that poses a possible threat to life or bodily function: [X] : The patient has a potentially serious acute condition or an acute exacerbation of a chronic illness requiring urgent evaluation and management in the Emergency Department. The clinical presentation necessitates immediate consideration of life-threatening or function-threatening diagnoses, even if they are ultimately ruled out.   FINAL CLINICAL IMPRESSION(S) / ED DIAGNOSES   Final diagnoses:  Fall, initial encounter  Closed fracture of distal end of left radius,  unspecified fracture morphology, initial encounter     Rx / DC Orders   ED Discharge Orders          Ordered    oxyCODONE -acetaminophen  (PERCOCET) 5-325 MG tablet  Every 4 hours PRN        01/29/24 2331             Note:  This document was prepared using Dragon voice recognition software and may include unintentional  dictation errors.   Nicholaus Rolland BRAVO, MD 01/30/24 [phone]

## 2024-01-29 NOTE — Discharge Instructions (Addendum)
 B?n ? ???c ??a ??n khoa c?p c?u sau khi b? ng. K?t qu? ki?m tra cho th?y b?n b? gy x??ng c? tay tri. X??ng ny ? ???c ??t l?i v? tr c? v n?p l?i. B?n c th? b? ?au. Ti ? k ??n thu?c Percocet, b?n c th? dng khi c?n thi?t ?? gi?m ?au d? d?i, nh?ng n?u khng, hy s? d?ng cc lo?i thu?c khng k ??n nh? Tylenol ho?c ibuprofen v Percocet c th? gy nghi?n. Ngy mai, vui lng g?i ??n phng khm c?a Bc s? [place] v ni r?ng b?n c?n ??t l?ch h?n vo ??u tu?n sau v tr??ng h?p c?a b?n ? ???c th?o lu?n t?i khoa c?p c?u v? gy x??ng c? tay. Hy quay l?i n?u c b?t k? tri?u ch?ng no tr? n?ng c?p tnh ho?c b?t k? tr??ng h?p kh?n c?p no khc. --  You were seen in the emergency department after a fall.  Workup demonstrated a left radial wrist fracture.  This was put back in place and splinted.  You may have pain.  I have prescribed some Percocet which you can take as needed for strong pain but otherwise use over-the-counter medications such as Tylenol or ibuprofen as the Percocet can be addictive.  Tomorrow please call Dr. Torrance office and states that you need an appointment early next week as your case was discussed in the emergency department for a wrist fracture.  Return with any acutely worsening symptoms or any other emergency. --
